# Patient Record
Sex: Male | Born: 1985 | Race: Black or African American | Hispanic: No | Marital: Single | Smoking: Never smoker
Health system: Southern US, Community
[De-identification: ages and names within clinical notes are randomized; demographics above are authoritative.]

## PROBLEM LIST (undated history)

## (undated) HISTORY — PX: OTHER SURGICAL HISTORY: SHX169

---

## 2015-05-05 ENCOUNTER — Emergency Department (HOSPITAL_COMMUNITY)
Admission: EM | Admit: 2015-05-05 | Discharge: 2015-05-05 | Disposition: A | Discharge status: Home / Self Care | Payer: BLUE CROSS/BLUE SHIELD | Attending: Emergency Medicine | Admitting: Emergency Medicine

## 2015-05-05 ENCOUNTER — Emergency Department (HOSPITAL_COMMUNITY): Payer: BLUE CROSS/BLUE SHIELD

## 2015-05-05 ENCOUNTER — Encounter (HOSPITAL_COMMUNITY): Payer: Self-pay

## 2015-05-05 DIAGNOSIS — Y9289 Other specified places as the place of occurrence of the external cause: Secondary | ICD-10-CM | POA: Diagnosis not present

## 2015-05-05 DIAGNOSIS — Y9389 Activity, other specified: Secondary | ICD-10-CM | POA: Diagnosis not present

## 2015-05-05 DIAGNOSIS — S9031XA Contusion of right foot, initial encounter: Secondary | ICD-10-CM | POA: Insufficient documentation

## 2015-05-05 DIAGNOSIS — S9032XA Contusion of left foot, initial encounter: Secondary | ICD-10-CM | POA: Diagnosis not present

## 2015-05-05 DIAGNOSIS — X58XXXA Exposure to other specified factors, initial encounter: Secondary | ICD-10-CM | POA: Diagnosis not present

## 2015-05-05 DIAGNOSIS — Y998 Other external cause status: Secondary | ICD-10-CM | POA: Diagnosis not present

## 2015-05-05 DIAGNOSIS — S99922A Unspecified injury of left foot, initial encounter: Secondary | ICD-10-CM | POA: Diagnosis present

## 2015-05-05 DIAGNOSIS — M79673 Pain in unspecified foot: Secondary | ICD-10-CM

## 2015-05-05 MED ORDER — OXYCODONE-ACETAMINOPHEN 5-325 MG PO TABS
1.0000 | ORAL_TABLET | Freq: Once | ORAL | Status: AC
  Administered 2015-05-05: 1 via ORAL
  Filled 2015-05-05: qty 1

## 2015-05-05 MED ORDER — NAPROXEN 500 MG PO TABS: 500.0000 mg | ORAL_TABLET | Freq: Two times a day (BID) | ORAL | Status: AC

## 2015-05-05 MED ORDER — OXYCODONE-ACETAMINOPHEN 5-325 MG PO TABS: 1.0000 | ORAL_TABLET | Freq: Once | ORAL | Status: DC

## 2015-05-05 MED ORDER — HYDROCODONE-ACETAMINOPHEN 5-325 MG PO TABS: 1.0000 | ORAL_TABLET | Freq: Four times a day (QID) | ORAL | Status: AC | PRN

## 2015-05-05 NOTE — ED Provider Notes (Signed)
CSN: [id number]     Arrival date & time 05/05/15  0034 History   First MD Initiated Contact with Patient 05/05/15 [phone]     Chief Complaint  Patient presents with  . Foot Pain     (Consider location/radiation/quality/duration/timing/severity/associated sxs/prior Treatment) Patient is a 29 y.o. male presenting with lower extremity pain. The history is provided by the patient. No language interpreter was used.  Foot Pain This is a new problem. The current episode started today. The problem occurs constantly. The problem has been gradually worsening. Associated symptoms include arthralgias. The symptoms are aggravated by walking. He has tried rest for the symptoms. The treatment provided no relief.    No past medical history on file. No past surgical history on file. No family history on file. History  Substance Use Topics  . Smoking status: Not on file  . Smokeless tobacco: Not on file  . Alcohol Use: Not on file    Review of Systems  Musculoskeletal: Positive for arthralgias.  All other systems reviewed and are negative.     Allergies  Review of patient's allergies indicates not on file.  Home Medications   Prior to Admission medications   Not on File   There were no vitals taken for this visit. Physical Exam  Constitutional: He is oriented to person, place, and time. He appears well-developed and well-nourished.  HENT:  Head: Atraumatic.  Eyes: Conjunctivae are normal.  Neck: Neck supple.  Cardiovascular: Normal rate and regular rhythm.   Pulmonary/Chest: Effort normal and breath sounds normal.  Abdominal: Soft. Bowel sounds are normal.  Musculoskeletal: He exhibits edema and tenderness.       Feet:  Lymphadenopathy:    He has no cervical adenopathy.  Neurological: He is alert and oriented to person, place, and time.  Skin: Skin is warm and dry.  Psychiatric: He has a normal mood and affect.  Nursing note and vitals reviewed.   ED Course  Procedures  (including critical care time) Labs Review Labs Reviewed - No data to display  Imaging Review Dg Foot Complete Left  05/05/2015   CLINICAL DATA:  Pain after trauma, landing on both feet on concrete.  EXAM: LEFT FOOT - COMPLETE 3+ VIEW  COMPARISON:  None.  FINDINGS: There is no evidence of fracture or dislocation. There is no evidence of arthropathy or other focal bone abnormality. Soft tissues are unremarkable.  IMPRESSION: Negative.   Electronically Signed   By: Ellery Plunkaniel R Mitchell M.D.   On: 05/05/2015 01:34   Dg Foot Complete Right  05/05/2015   CLINICAL DATA:  Pain after trauma, landing on both feet on concrete.  EXAM: RIGHT FOOT COMPLETE - 3+ VIEW  COMPARISON:  None.  FINDINGS: There is no evidence of fracture or dislocation. There is no evidence of arthropathy or other focal bone abnormality. Soft tissues are unremarkable.  IMPRESSION: Negative.   Electronically Signed   By: Ellery Plunkaniel R Mitchell M.D.   On: 05/05/2015 01:35     EKG Interpretation None     Patient performed back-flip at work, not wearing shoes, landing on concrete floor.  Pain to bilateral heels and arch area of feet.   Radiology results reviewed and shared with patient.  No acute findings. MDM   Final diagnoses:  None    Bilateral foot contusion. Ace wrap. Crutches. Ortho follow-up if no improvement with treatment plan. Return precautions discussed.    Felicie Mornavid Angelin Cutrone, NP 05/05/15 [id number]  Dione Boozeavid Glick, MD 05/05/15 [phone]

## 2015-05-05 NOTE — ED Notes (Signed)
Pt reports that he did a backflip barefoot and landed on concrete. Complaining of R midfoot pain. No obvious deformity, some swelling. Rates pain @ 9/10.

## 2015-05-05 NOTE — Discharge Instructions (Signed)
Foot Contusion  A foot contusion is a deep bruise to the foot. Contusions happen when an injury causes bleeding under the skin. Signs of bruising include pain, puffiness (swelling), and discolored skin. The contusion may turn blue, purple, or yellow. HOME CARE  Put ice on the injured area.  Put ice in a plastic bag.  Place a towel between your skin and the bag.  Leave the ice on for 15-20 minutes, 03-04 times a day.  Only take medicines as told by your doctor.  Use an elastic wrap only as told. You may remove the wrap for sleeping, showering, and bathing. Take the wrap off if you lose feeling (numb) in your toes, or they turn blue or cold. Put the wrap on more loosely.  Keep the foot raised (elevated) with pillows.  If your foot hurts, avoid standing or walking.  When your doctor says it is okay to use your foot, start using it slowly. If you have pain, lessen how much you use your foot.  See your doctor as told. GET HELP RIGHT AWAY IF:  1. You have more redness, puffiness, or pain in your foot. 2. Your puffiness or pain does not get better with medicine. 3. You lose feeling in your foot, or you cannot move your toes. 4. Your foot turns cold or blue. 5. You have pain when you move your toes. 6. Your foot feels warm. MAKE SURE YOU:  1. Understand these instructions. 2. Will watch this condition. 3. Will get help right away if you or your child is not doing well or gets worse. Document Released: 09/23/2008 Document Revised: 06/15/2012 Document Reviewed: 11/18/2011 [facility] Patient Information 2015 [place], [place]. This information is not intended to replace advice given to you by your health care provider. Make sure you discuss any questions you have with your health care provider. Crutch Use Crutches take weight off one of your legs or feet when you stand or walk. It is important to use crutches that fit right. Your crutches fit right if:  You can fit 2-3 fingers between your  armpit and the crutch.  You use your hands, not your armpits, to hold yourself up. Do not put your armpits on the crutches. This can damage the nerves in your hands and arms. Crutches should be a little below your armpits. HOW TO USE YOUR CRUTCHES Walking 7. Step with the crutches. 8. Swing the good leg a little bit in front of the crutches. Going Up Steps If there is no handrail: 4. Step up with the good leg. 5. Step up with the crutches and hurt leg. 6. Continue in this way. If there is a handrail: 1. Hold both crutches in one hand. 2. Place your free hand on the handrail. 3. Put your weight on your arms and lift your good leg to the step. 4. Bring the crutches and the hurt leg up to that step. 5. Continue in this way. Going Down Steps Be very careful, as going down stairs with crutches is very challenging. If there is no handrail: 1. Step down with the hurt leg and crutches. 2. Step down with the good leg. If there is a handrail: 1. Place your hand on the handrail. 2. Hold both crutches with your free hand. 3. Lower your hurt leg and crutch to the step below you. Make sure to keep the crutch tips in the center of the step, never on the edge. 4. Lower your good leg to that step. 5. Continue in  this way. Standing Up 1. Hold the hurt leg forward. 2. Grab the armrest with one hand and the top of the crutches with the other hand. 3. Pull yourself up to a standing position. Sitting Down 1. Hold the hurt leg forward. 2. Grab the armrest with one hand and the top of the crutches with the other hand. 3.  Lower yourself to a sitting position. GET HELP IF:  You still feel wobbly on your feet.  You develop new pain, for example in your armpits, back, shoulder, wrist, or hip.  You cannot feel a part of your body (numb).  You have tingling. GET HELP RIGHT AWAY IF: You fall. Document Released: 06/02/2008 Document Revised: 10/05/2013 Document Reviewed: 08/22/2013 [facility] Patient  Information 2015 [place], [place]. This information is not intended to replace advice given to you by your health care provider. Make sure you discuss any questions you have with your health care provider.

## 2016-11-27 IMAGING — CR DG FOOT COMPLETE 3+V*L*
3 series · 3 of 3 positions shown · non-contrast
Comparison: None.

CLINICAL DATA: Pain after trauma, landing on both feet on concrete.

EXAM:
LEFT FOOT - COMPLETE 3+ VIEW

[foot ap]
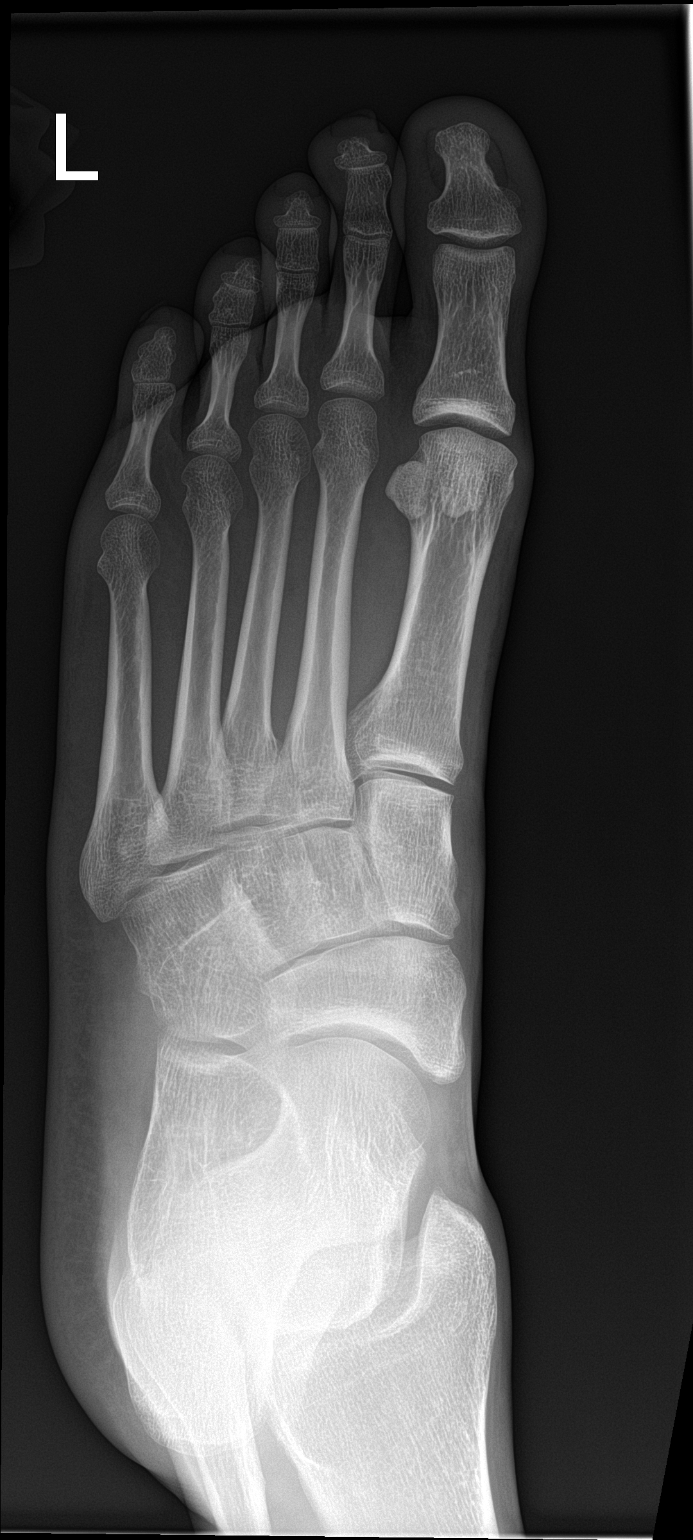

[foot obl]
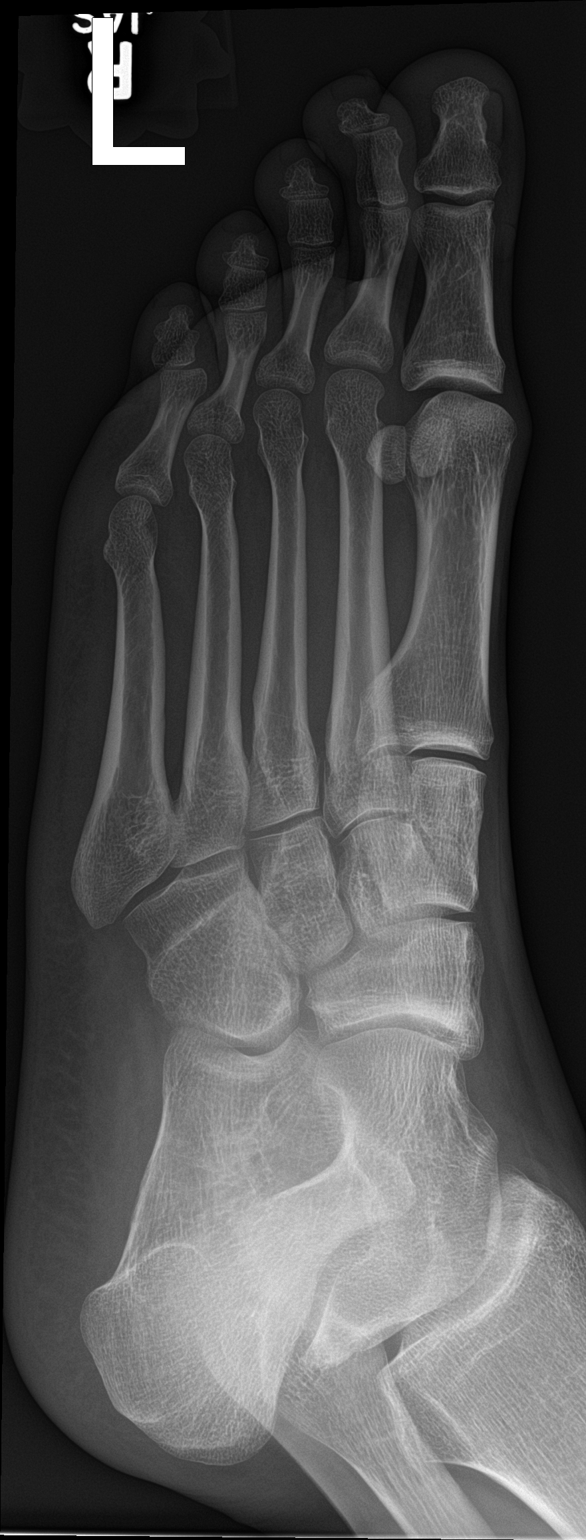

[foot lat]
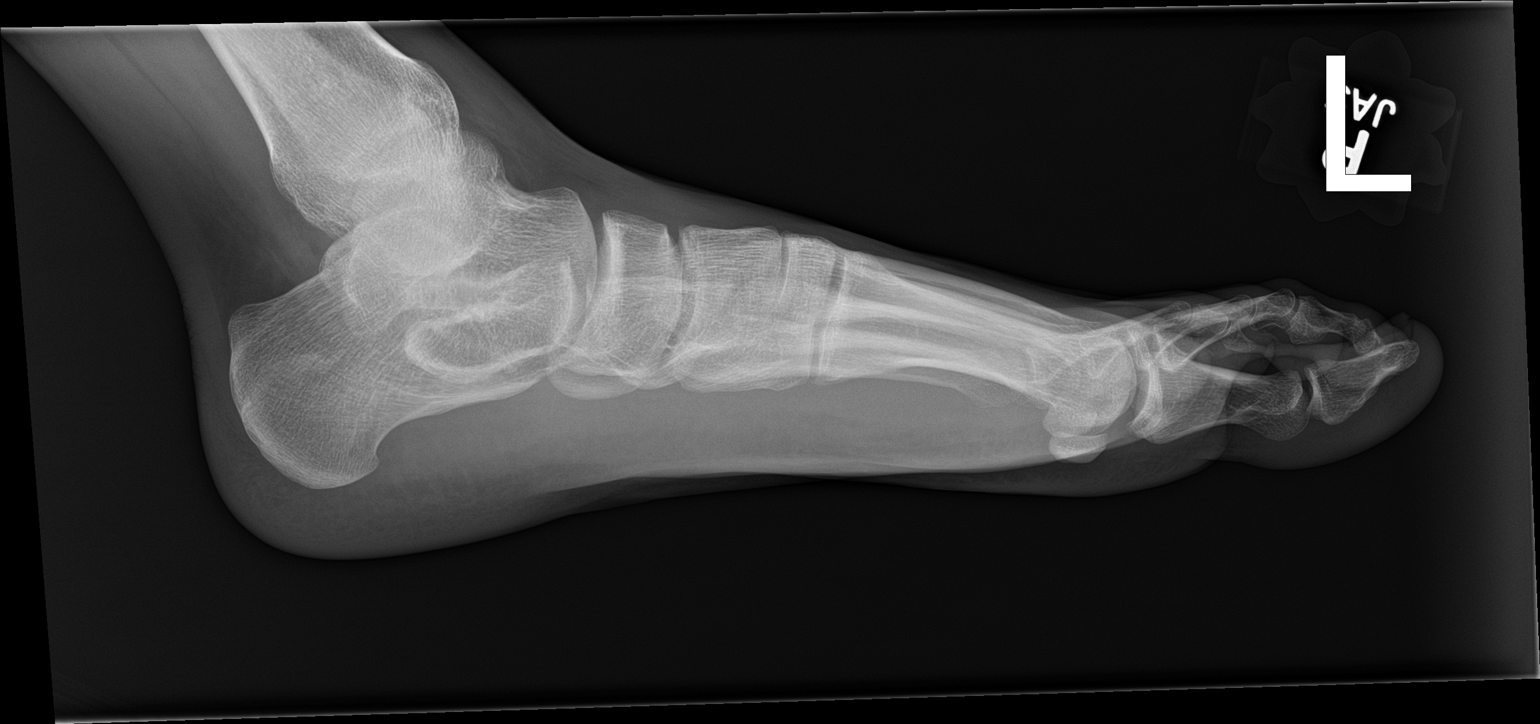

[3 of 3 positions shown; findings below may reference images not displayed]

FINDINGS: There is no evidence of fracture or dislocation. There is no
evidence of arthropathy or other focal bone abnormality. Soft
tissues are unremarkable.
IMPRESSION: Negative.
